# Patient Record
Sex: Female | Born: 1995 | Race: White | Hispanic: No | Marital: Single | Smoking: Never smoker
Health system: Southern US, Community
[De-identification: ages and names within clinical notes are randomized; demographics above are authoritative.]

## PROBLEM LIST (undated history)

## (undated) DIAGNOSIS — G43909 Migraine, unspecified, not intractable, without status migrainosus: Secondary | ICD-10-CM

## (undated) DIAGNOSIS — F909 Attention-deficit hyperactivity disorder, unspecified type: Secondary | ICD-10-CM

## (undated) DIAGNOSIS — L659 Nonscarring hair loss, unspecified: Secondary | ICD-10-CM

## (undated) DIAGNOSIS — K219 Gastro-esophageal reflux disease without esophagitis: Secondary | ICD-10-CM

## (undated) DIAGNOSIS — E079 Disorder of thyroid, unspecified: Secondary | ICD-10-CM

## (undated) HISTORY — DX: Nonscarring hair loss, unspecified: L65.9

## (undated) HISTORY — DX: Disorder of thyroid, unspecified: E07.9

## (undated) HISTORY — DX: Migraine, unspecified, not intractable, without status migrainosus: G43.909

## (undated) HISTORY — DX: Attention-deficit hyperactivity disorder, unspecified type: F90.9

## (undated) HISTORY — PX: WISDOM TOOTH EXTRACTION: SHX21

## (undated) HISTORY — DX: Gastro-esophageal reflux disease without esophagitis: K21.9

---
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
## 2015-08-26 ENCOUNTER — Ambulatory Visit: Payer: Self-pay | Admitting: Physician Assistant

## 2017-05-11 ENCOUNTER — Ambulatory Visit: Payer: Self-pay | Admitting: "Endocrinology

## 2020-04-24 LAB — BASIC METABOLIC PANEL
BUN: 13 (ref 4–21)
Creatinine: 0.8 (ref 0.5–1.1)
Glucose: 92

## 2020-04-24 LAB — TSH: TSH: 1.99 (ref 0.41–5.90)

## 2020-06-12 ENCOUNTER — Other Ambulatory Visit: Payer: Self-pay

## 2020-06-12 ENCOUNTER — Encounter: Payer: Self-pay | Admitting: "Endocrinology

## 2020-06-12 ENCOUNTER — Ambulatory Visit (INDEPENDENT_AMBULATORY_CARE_PROVIDER_SITE_OTHER): Payer: Self-pay | Admitting: "Endocrinology

## 2020-06-12 VITALS — BP 122/81 | HR 84 | Ht 66.0 in | Wt 250.0 lb

## 2020-06-12 DIAGNOSIS — R7989 Other specified abnormal findings of blood chemistry: Secondary | ICD-10-CM

## 2020-06-12 DIAGNOSIS — E049 Nontoxic goiter, unspecified: Secondary | ICD-10-CM | POA: Insufficient documentation

## 2020-06-12 NOTE — Progress Notes (Signed)
Endocrinology Consult Note                                            06/12/2020, 2:09 PM   Subjective:    Patient ID: Sue Glass, female    DOB: [birth year], PCP Sue Glass   Past Medical History:  Diagnosis Date  . ADHD   . GERD (gastroesophageal reflux disease)   . Hair loss   . Migraines   . Thyroid disease    Past Surgical History:  Procedure Laterality Date  . WISDOM TOOTH EXTRACTION     Social History   Socioeconomic History  . Marital status: Single    Spouse name: Not on file  . Number of children: Not on file  . Years of education: Not on file  . Highest education level: Not on file  Occupational History  . Not on file  Tobacco Use  . Smoking status: Never Smoker  Vaping Use  . Vaping Use: Never used  Substance and Sexual Activity  . Alcohol use: Yes    Comment: occasionally  . Drug use: Never  . Sexual activity: Not on file  Other Topics Concern  . Not on file  Social History Narrative  . Not on file   Social Determinants of Health   Financial Resource Strain:   . Difficulty of Paying Living Expenses:   Food Insecurity:   . Worried About Programme researcher, broadcasting/film/video in the Last Year:   . Barista in the Last Year:   Transportation Needs:   . Freight forwarder (Medical):   Marland Kitchen Lack of Transportation (Non-Medical):   Physical Activity:   . Days of Exercise per Week:   . Minutes of Exercise per Session:   Stress:   . Feeling of Stress :   Social Connections:   . Frequency of Communication with Friends and Family:   . Frequency of Social Gatherings with Friends and Family:   . Attends Religious Services:   . Active Member of Clubs or Organizations:   . Attends Banker Meetings:   Marland Kitchen Marital Status:    Family History  Problem Relation Age of Onset  . Hypertension Father    Outpatient Encounter Medications as of 06/12/2020  Medication Sig  . omeprazole (PRILOSEC) 20 MG capsule Take 20 mg by  mouth daily.  . propranolol ER (INDERAL LA) 80 MG 24 hr capsule Take 80 mg by mouth at bedtime.  . SUMAtriptan (IMITREX) 100 MG tablet Take 100 mg by mouth every 2 (two) hours as needed for migraine. May repeat in 2 hours if headache persists or recurs.  . [DISCONTINUED] ketoconazole (NIZORAL) 2 % shampoo Apply 1 application topically 2 (two) times a week.   No facility-administered encounter medications on file as of 06/12/2020.   ALLERGIES: No Known Allergies  VACCINATION STATUS:  There is no immunization history on file for this patient.  HPI Sue Glass is 24 y.o. female who presents today with a medical history as above. she is being seen in consultation for abnormal thyroid function tests requested by Sue Shivers, PA-C.  History is obtained directly from the patient.  She reports that she was found to have an abnormal thyroid function test previously, most recently only TSH was measured and was normal. -She denies ever requiring thyroid hormone supplements  nor antithyroid intervention.  She denies dysphagia, shortness of breath, nor voice change.  She denies heat intolerance, palpitations.  She has essential tremors. She has no immediate family with thyroid problems.  She complains of some hair loss, and progressive weight gain.   Review of Systems  Constitutional: +  weight gain, no fatigue, no subjective hyperthermia, no subjective hypothermia Eyes: no blurry vision, no xerophthalmia ENT: no sore throat, no nodules palpated in throat, no dysphagia/odynophagia, no hoarseness Cardiovascular: no Chest Pain, no Shortness of Breath, no palpitations, no leg swelling Respiratory: no cough, no shortness of breath Gastrointestinal: no Nausea/Vomiting/Diarhhea Musculoskeletal: no muscle/joint aches Skin: no rashes, +hair loss Neurological: no tremors, no numbness, no tingling, no dizziness Psychiatric: no depression, no anxiety  Objective:    Vitals with BMI 06/12/2020   Height 5\' 6"   Weight 250 lbs  BMI 40.37  Systolic 122  Diastolic 81  Pulse 84    BP 122/81   Pulse 84   Ht 5\' 6"  (1.676 m)   Wt 250 lb (113.4 kg)   BMI 40.35 kg/m   Wt Readings from Last 3 Encounters:  06/12/20 250 lb (113.4 kg)    Physical Exam  Constitutional:  Body mass index is 40.35 kg/m.,  not in acute distress, normal state of mind Eyes: PERRLA, EOMI, no exophthalmos ENT: moist mucous membranes, + gross thyromegaly, no gross cervical lymphadenopathy Cardiovascular: normal precordial activity, Regular Rate and Rhythm, no Murmur/Rubs/Gallops Respiratory:  adequate breathing efforts, no gross chest deformity, Clear to auscultation bilaterally Gastrointestinal: abdomen soft, Non -tender, No distension, Bowel Sounds present, no gross organomegaly Musculoskeletal: no gross deformities, strength intact in all four extremities Skin: moist, warm, no rashes Neurological:+ mild tremor with outstretched hands, Deep tendon reflexes normal in bilateral lower extremities.  CMP ( most recent) CMP     Component Value Date/Time   BUN 13 04/24/2020 0000   CREATININE 0.8 04/24/2020 0000      Lab Results  Component Value Date   TSH 1.99 04/24/2020      Assessment & Plan:   1. Abnormal thyroid blood test 2. Goiter - Sue Glass  is being seen at a kind request of 04/26/2020, Sue Glass. - I have reviewed her available thyroid records and clinically evaluated the patient. - Based on these reviews, she has mild goiter,  however,  there is not sufficient information to proceed with definitive treatment plan.  - she will need a repeat, or complete thyroid function test to confirm the diagnosis.  She will also need thyroid ultrasound given her clinical goiter.  -She will return in 10-15 days for reevaluation and review of her results.  - I did not initiate any Sue prescriptions today. - she is advised to maintain close follow up with Sue [place], PA-C for  primary care needs.   - Time spent with the patient: 45 minutes, of which >50% was spent in  counseling her about her abnormal thyroid function tests, goiter and the rest in obtaining information about her symptoms, reviewing her previous labs/studies ( including abstractions from other facilities),  evaluations, and treatments,  and developing a plan to confirm diagnosis and long term treatment based on the latest standards of care/guidelines; and documenting her care.  05-04-1986 participated in the discussions, expressed understanding, and voiced agreement with the above plans.  All questions were answered to her satisfaction. she is encouraged to contact clinic should she have any questions or concerns prior to her return visit.  Follow up plan: Return in about 10 days (around 06/22/2020) for Labs Today- Non-Fasting Ok, Thyroid / Neck Ultrasound.   Glade Lloyd, MD [facility] [address] [place], [place] [address] Phone: [phone]  Fax: [phone]     06/12/2020, 2:09 PM  This note was partially dictated with voice recognition software. Similar sounding words can be transcribed inadequately or may not  be corrected upon review.

## 2020-07-06 ENCOUNTER — Ambulatory Visit: Payer: Self-pay | Admitting: "Endocrinology

## 2021-06-07 ENCOUNTER — Other Ambulatory Visit: Payer: Self-pay

## 2021-06-07 ENCOUNTER — Emergency Department (HOSPITAL_COMMUNITY)
Admission: EM | Admit: 2021-06-07 | Discharge: 2021-06-07 | Disposition: A | Discharge status: Home / Self Care | Payer: BC Managed Care – PPO | Attending: Emergency Medicine | Admitting: Emergency Medicine

## 2021-06-07 DIAGNOSIS — Z5321 Procedure and treatment not carried out due to patient leaving prior to being seen by health care provider: Secondary | ICD-10-CM | POA: Insufficient documentation

## 2021-06-07 DIAGNOSIS — J029 Acute pharyngitis, unspecified: Secondary | ICD-10-CM | POA: Insufficient documentation

## 2021-06-07 NOTE — ED Notes (Signed)
LWBS 

## 2021-06-07 NOTE — ED Provider Notes (Signed)
Emergency Medicine Provider Triage Evaluation Note  Sue Glass , a 25 y.o. female  was evaluated in triage.  Pt complains of globus sensation x 4 days. Denies taking any pills or eating foods with bones in it before this started. States it has become gradually more painful since that time and she is now having difficulty swallowing food and drink. Significant pain in left lower anterior neck with swallowing.   Review of Systems  Positive: Globus sensation, pain with swallowing, dysphagia,cough Negative: Fevers, chills, SOB, CP  Physical Exam  BP 127/89 (BP Location: Left Arm)   Pulse 94   Temp 98.8 F (37.1 C) (Oral)   Resp 14   LMP 06/05/2021   SpO2 100%  Gen:   Awake, no distress   Resp:  Normal effort  MSK:   Moves extremities without difficulty  Other:  Trachea midline  Medical Decision Making  Medically screening exam initiated at 2:45 PM.  Appropriate orders placed.  PIERRETTE SCHEU was informed that the remainder of the evaluation will be completed by another provider, this initial triage assessment does not replace that evaluation, and the importance of remaining in the ED until their evaluation is complete.  This chart was dictated using voice recognition software, Dragon. Despite the best efforts of this provider to proofread and correct errors, errors may still occur which can change documentation meaning.Marland Kitchen    Paris Lore, PA-C 06/07/21 1447    Mancel Bale, MD 06/07/21 1818

## 2021-06-07 NOTE — ED Triage Notes (Signed)
Pt with hx of GERD here for eval of feeling like something is stuck in her throat. Onset was four mornings ago when she woke up. Denies any food being stuck. Painful to swallow.

## 2023-02-28 DIAGNOSIS — N912 Amenorrhea, unspecified: Secondary | ICD-10-CM | POA: Diagnosis not present

## 2023-02-28 DIAGNOSIS — N970 Female infertility associated with anovulation: Secondary | ICD-10-CM | POA: Diagnosis not present

## 2023-02-28 DIAGNOSIS — N914 Secondary oligomenorrhea: Secondary | ICD-10-CM | POA: Diagnosis not present

## 2023-04-07 ENCOUNTER — Ambulatory Visit
Admission: EM | Admit: 2023-04-07 | Discharge: 2023-04-07 | Disposition: A | Discharge status: Home / Self Care | Payer: 59 | Attending: Urgent Care | Admitting: Urgent Care

## 2023-04-07 ENCOUNTER — Ambulatory Visit (INDEPENDENT_AMBULATORY_CARE_PROVIDER_SITE_OTHER): Payer: 59

## 2023-04-07 DIAGNOSIS — R059 Cough, unspecified: Secondary | ICD-10-CM

## 2023-04-07 DIAGNOSIS — J209 Acute bronchitis, unspecified: Secondary | ICD-10-CM | POA: Diagnosis not present

## 2023-04-07 MED ORDER — PREDNISONE 20 MG PO TABS: ORAL_TABLET | ORAL | 0 refills | Status: DC

## 2023-04-07 MED ORDER — ALBUTEROL SULFATE HFA 108 (90 BASE) MCG/ACT IN AERS: 1.0000 | INHALATION_SPRAY | Freq: Four times a day (QID) | RESPIRATORY_TRACT | 0 refills | Status: AC | PRN

## 2023-04-07 MED ORDER — PROMETHAZINE-DM 6.25-15 MG/5ML PO SYRP: 5.0000 mL | ORAL_SOLUTION | Freq: Three times a day (TID) | ORAL | 0 refills | Status: DC | PRN

## 2023-04-07 NOTE — ED Provider Notes (Signed)
[facility]  Note:  This document was prepared using Conservation officer, historic buildings and may include unintentional dictation errors.  MRN: [id number] DOB: 19-Feb-1996  Subjective:   Sue Glass is a 27 y.o. female presenting for 5 day history of acute onset productive cough, wheezing, sinus congestion and drainage. Is having some shortness of breath with coughing fits.  Has been using Zyrtec with some relief but still has a persistent cough. No smoking of any kind including cigarettes, cigars, vaping, marijuana use.    No current facility-administered medications for this encounter.  Current Outpatient Medications:    omeprazole (PRILOSEC) 20 MG capsule, Take 20 mg by mouth daily., Disp: , Rfl:    propranolol ER (INDERAL LA) 80 MG 24 hr capsule, Take 80 mg by mouth at bedtime., Disp: , Rfl:    SUMAtriptan (IMITREX) 100 MG tablet, Take 100 mg by mouth every 2 (two) hours as needed for migraine. May repeat in 2 hours if headache persists or recurs., Disp: , Rfl:    No Known Allergies  Past Medical History:  Diagnosis Date   ADHD    GERD (gastroesophageal reflux disease)    Hair loss    Migraines    Thyroid disease      Past Surgical History:  Procedure Laterality Date   WISDOM TOOTH EXTRACTION      Family History  Problem Relation Age of Onset   Hypertension Father     Social History   Tobacco Use   Smoking status: Never   Smokeless tobacco: Never  Vaping Use   Vaping Use: Never used  Substance Use Topics   Alcohol use: Not Currently   Drug use: Never    ROS   Objective:   Vitals: BP (!) 122/59 (BP Location: Left Arm)   Pulse 88   Temp 98.7 F (37.1 C) (Oral)   Resp 20   LMP 02/25/2023 (Approximate)   SpO2 95%   Physical Exam Constitutional:      General: She is not in acute distress.    Appearance: Normal appearance. She is well-developed. She is not ill-appearing, toxic-appearing or diaphoretic.  HENT:     Head:  Normocephalic and atraumatic.     Nose: Nose normal.     Mouth/Throat:     Mouth: Mucous membranes are moist.  Eyes:     General: No scleral icterus.       Right eye: No discharge.        Left eye: No discharge.     Extraocular Movements: Extraocular movements intact.  Cardiovascular:     Rate and Rhythm: Normal rate and regular rhythm.     Heart sounds: Normal heart sounds. No murmur heard.    No friction rub. No gallop.  Pulmonary:     Effort: Pulmonary effort is normal. No respiratory distress.     Breath sounds: No stridor. Examination of the right-middle field reveals rhonchi. Examination of the left-middle field reveals rhonchi. Examination of the left-lower field reveals rhonchi. Rhonchi present. No wheezing or rales.  Chest:     Chest wall: No tenderness.  Skin:    General: Skin is warm and dry.  Neurological:     General: No focal deficit present.     Mental Status: She is alert and oriented to person, place, and time.  Psychiatric:        Mood and Affect: Mood normal.        Behavior: Behavior normal.    DG Chest 2  View  Result Date: 04/07/2023 CLINICAL DATA:  Left-sided bronchi.  Productive cough. EXAM: CHEST - 2 VIEW COMPARISON:  None Available. FINDINGS: The heart size and mediastinal contours are within normal limits. Both lungs are clear. The visualized skeletal structures are unremarkable. IMPRESSION: Negative two view chest x-ray Electronically Signed   By: Marin Roberts M.D.   On: 04/07/2023 16:05     Assessment and Plan :   PDMP not reviewed this encounter.  1. Acute bronchitis, unspecified organism     Will manage for bronchitis with prednisone, albuterol, general supportive care. Counseled patient on potential for adverse effects with medications prescribed/recommended today, ER and return-to-clinic precautions discussed, patient verbalized understanding.    Wallis Bamberg, [place] 04/07/23 6151

## 2023-04-07 NOTE — ED Triage Notes (Signed)
Pt c/o dry/prod cough, wheezing, nasal congestion x 5 days-taking zyrtec with some relief except for cough-NAD-steady gait

## 2024-10-12 ENCOUNTER — Encounter (HOSPITAL_COMMUNITY): Payer: Self-pay

## 2024-10-12 ENCOUNTER — Ambulatory Visit (HOSPITAL_COMMUNITY)
Admission: EM | Admit: 2024-10-12 | Discharge: 2024-10-12 | Disposition: A | Discharge status: Home / Self Care | Payer: Self-pay | Attending: Family Medicine | Admitting: Family Medicine

## 2024-10-12 DIAGNOSIS — M7711 Lateral epicondylitis, right elbow: Secondary | ICD-10-CM

## 2024-10-12 MED ORDER — METHYLPREDNISOLONE 4 MG PO TBPK: ORAL_TABLET | ORAL | 0 refills | Status: AC

## 2024-10-16 NOTE — ED Provider Notes (Signed)
 [facility]   [id number] 10/12/24 Arrival Time: 1339  ASSESSMENT & PLAN:  1. Lateral epicondylitis of right elbow    Tennis elbow strap applied. To wear for the next week as able.  Discharge Medication List as of 10/12/2024  3:40 PM     START taking these medications   Details  methylPREDNISolone (MEDROL DOSEPAK) 4 MG TBPK tablet Take as directed., Normal        Orders Placed This Encounter  Procedures   Apply other splint   Work/school excuse note: not needed. Recommend:  Follow-up Information     [facility].   Why: If worsening or failing to improve as anticipated. Contact information: [address] [place]  [address] [phone]                Reviewed expectations re: course of current medical issues. Questions answered. Outlined signs and symptoms indicating need for more acute intervention. Patient verbalized understanding. After Visit Summary given.  SUBJECTIVE: History from: patient. Sue Glass is a 28 y.o. female who reports non-traumatic pain over R lateral elbow; gradual onset; noted about 2 mos ago; just sore all the time; does move elbow repetitively at work. Denies extremity sensation changes or weakness.  No tx PTA.  Past Surgical History:  Procedure Laterality Date   WISDOM TOOTH EXTRACTION        OBJECTIVE:  Vitals:   10/12/24 1444  BP: 129/69  Pulse: 67  Resp: 18  Temp: 98.6 F (37 C)  TempSrc: Oral  SpO2: 98%    General appearance: alert; no distress HEENT: [place]; AT Neck: supple with FROM Resp: unlabored respirations Extremities: RUE: warm with well perfused appearance; well localized moderate tenderness over right lateral epicondyle of R elbow; without gross deformities; swelling: none; bruising: none; elbow ROM: normal CV: brisk extremity capillary refill of RUE; 2+ radial pulse of RUE. Skin: warm and dry; no visible rashes Neurologic: gait  normal; normal sensation and strength of RUE Psychological: alert and cooperative; normal mood and affect  Imaging: No results found.    No Known Allergies  Past Medical History:  Diagnosis Date   ADHD    GERD (gastroesophageal reflux disease)    Hair loss    Migraines    Thyroid  disease    Social History   Socioeconomic History   Marital status: Single    Spouse name: Not on file   Number of children: Not on file   Years of education: Not on file   Highest education level: Not on file  Occupational History   Not on file  Tobacco Use   Smoking status: Never   Smokeless tobacco: Never  Vaping Use   Vaping status: Never Used  Substance and Sexual Activity   Alcohol use: Not Currently   Drug use: Never   Sexual activity: Not on file  Other Topics Concern   Not on file  Social History Narrative   Not on file   Social Drivers of Health   Financial Resource Strain: Low Risk  (07/13/2021)   Received from [facility]   Overall Financial Resource Strain (CARDIA)    Difficulty of Paying Living Expenses: Not hard at all  Food Insecurity: No Food Insecurity (07/13/2021)   Received from [facility]   Hunger Vital Sign    Within the past 12 months, you worried that your food would run out before you got the money to buy more.: Never true  Within the past 12 months, the food you bought just didn't last and you didn't have money to get more.: Never true  Transportation Needs: No Transportation Needs (07/13/2021)   Received from [facility]   PRAPARE - Transportation    Lack of Transportation (Medical): No    Lack of Transportation (Non-Medical): No  Physical Activity: Inactive (07/28/2021)   Received from [facility]   Exercise Vital Sign    On average, how many days per week do you engage in moderate to strenuous exercise (like a brisk walk)?: 0 days    On average, how many minutes do you engage in exercise at this level?: 0 min  Stress: No Stress Concern  Present (07/28/2021)   Received from [facility] of Occupational Health - Occupational Stress Questionnaire    Feeling of Stress : Only a little  Social Connections: Moderately Isolated (07/13/2021)   Received from [facility]   Social Connection and Isolation Panel    In a typical week, how many times do you talk on the phone with family, friends, or neighbors?: Once a week    How often do you get together with friends or relatives?: Once a week    How often do you attend church or religious services?: 1 to 4 times per year    Do you belong to any clubs or organizations such as church groups, unions, fraternal or athletic groups, or school groups?: No    How often do you attend meetings of the clubs or organizations you belong to?: 1 to 4 times per year    Are you married, widowed, divorced, separated, never married, or living with a partner?: Never married   Family History  Problem Relation Age of Onset   Hypertension Father    Past Surgical History:  Procedure Laterality Date   WISDOM TOOTH EXTRACTION         Rolinda Rogue, MD 10/16/24 1139
